# Patient Record
Sex: Male | Born: 1955 | Race: Black or African American | Hispanic: No | Marital: Married
Health system: Southern US, Community
[De-identification: ages and names within clinical notes are randomized; demographics above are authoritative.]

---

## 1999-07-01 ENCOUNTER — Observation Stay (HOSPITAL_COMMUNITY): Admission: EM | Admit: 1999-07-01 | Discharge: 1999-07-02 | Payer: Self-pay | Admitting: Podiatry

## 1999-07-01 ENCOUNTER — Encounter: Payer: Self-pay | Admitting: *Deleted

## 2002-04-21 ENCOUNTER — Encounter: Admission: RE | Admit: 2002-04-21 | Discharge: 2002-04-21 | Payer: Self-pay | Admitting: Internal Medicine

## 2002-04-21 ENCOUNTER — Encounter: Payer: Self-pay | Admitting: Internal Medicine

## 2005-03-08 ENCOUNTER — Encounter: Admission: RE | Admit: 2005-03-08 | Discharge: 2005-03-08 | Payer: Self-pay | Admitting: Internal Medicine

## 2007-01-21 IMAGING — CR DG HIP (WITH OR WITHOUT PELVIS) 2-3V*L*
3 series · 3 of 3 positions shown · non-contrast
Comparison: None.

CLINICAL DATA: Left hip pain.  
 LEFT HIP COMPLETE:

[view not recorded (1 of 3)]
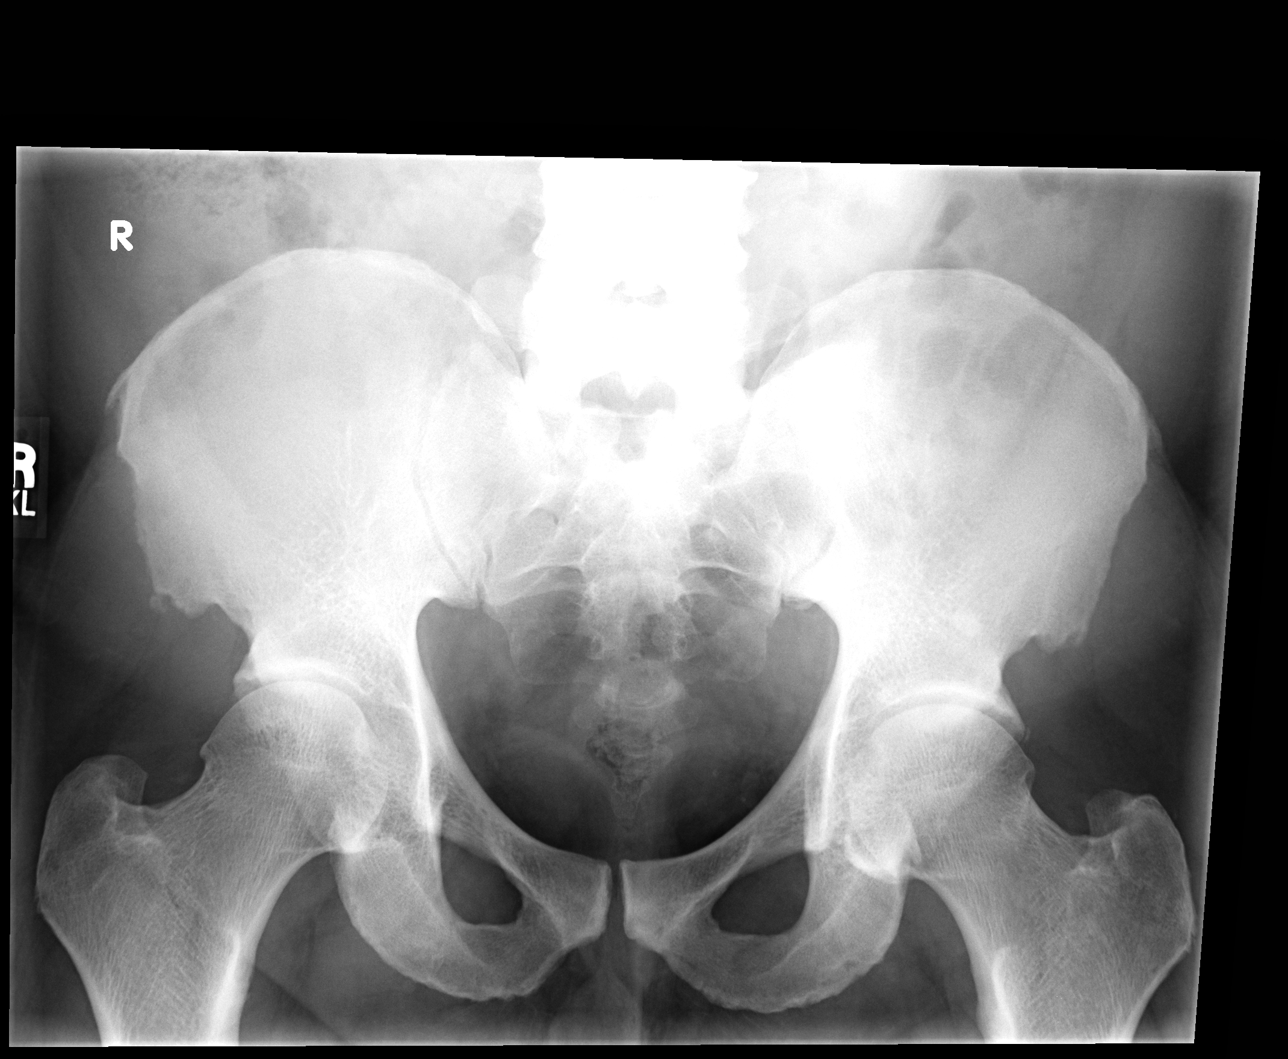

[view not recorded (2 of 3)]
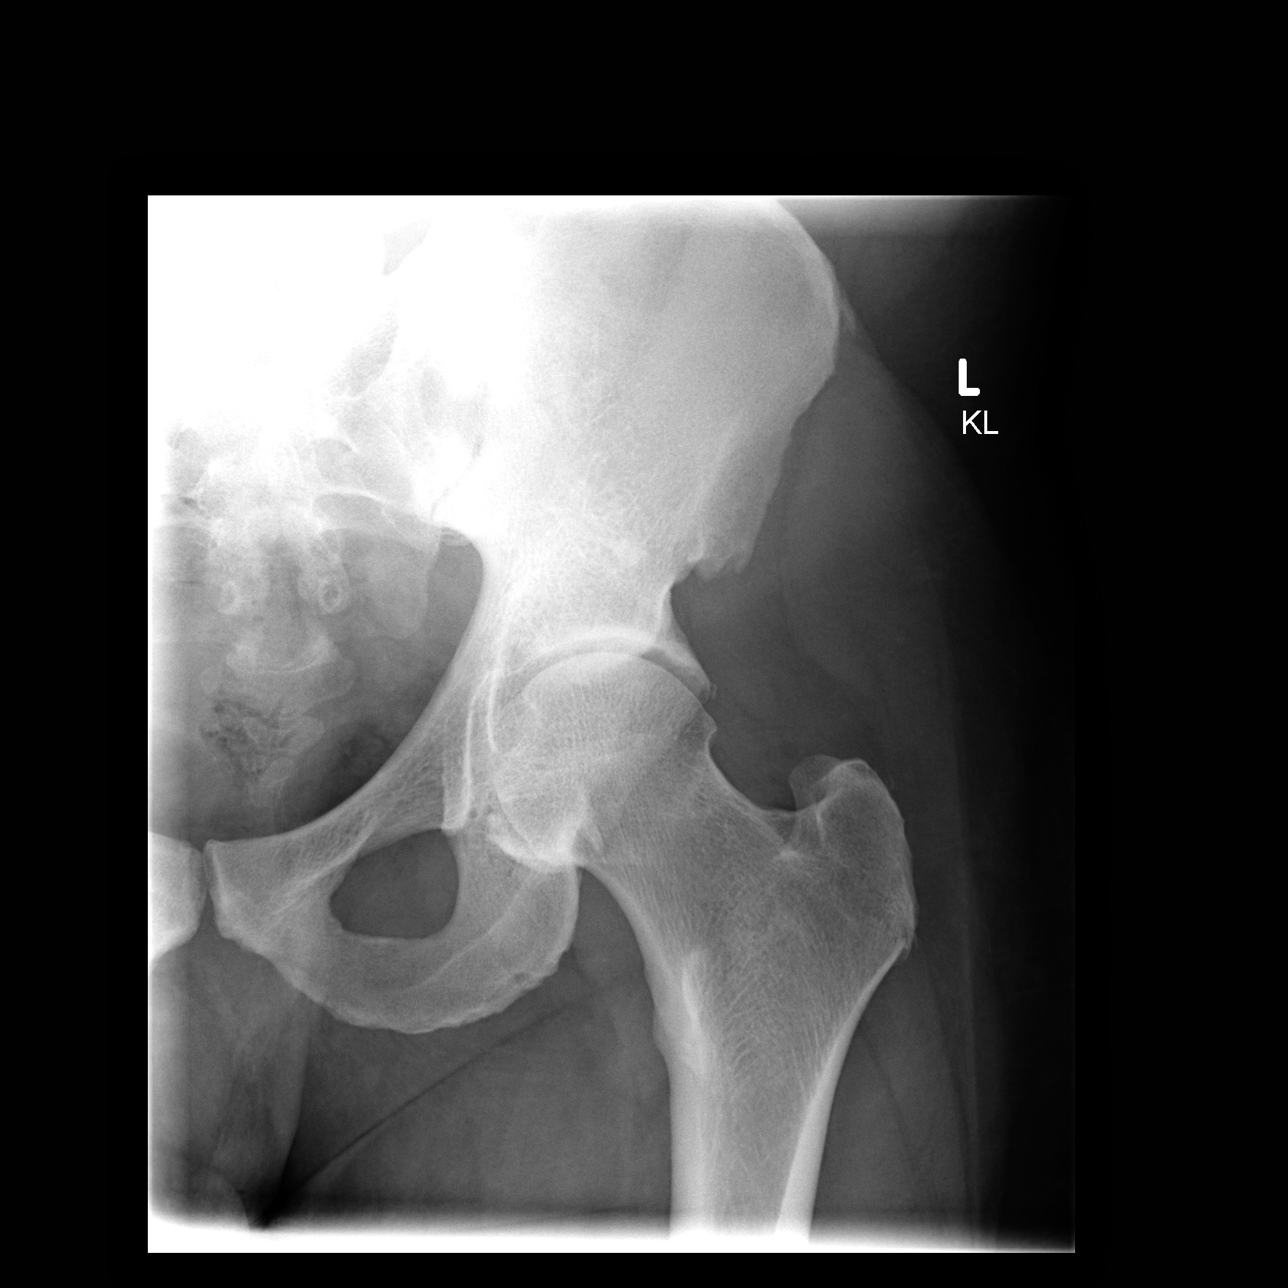

[view not recorded (3 of 3)]
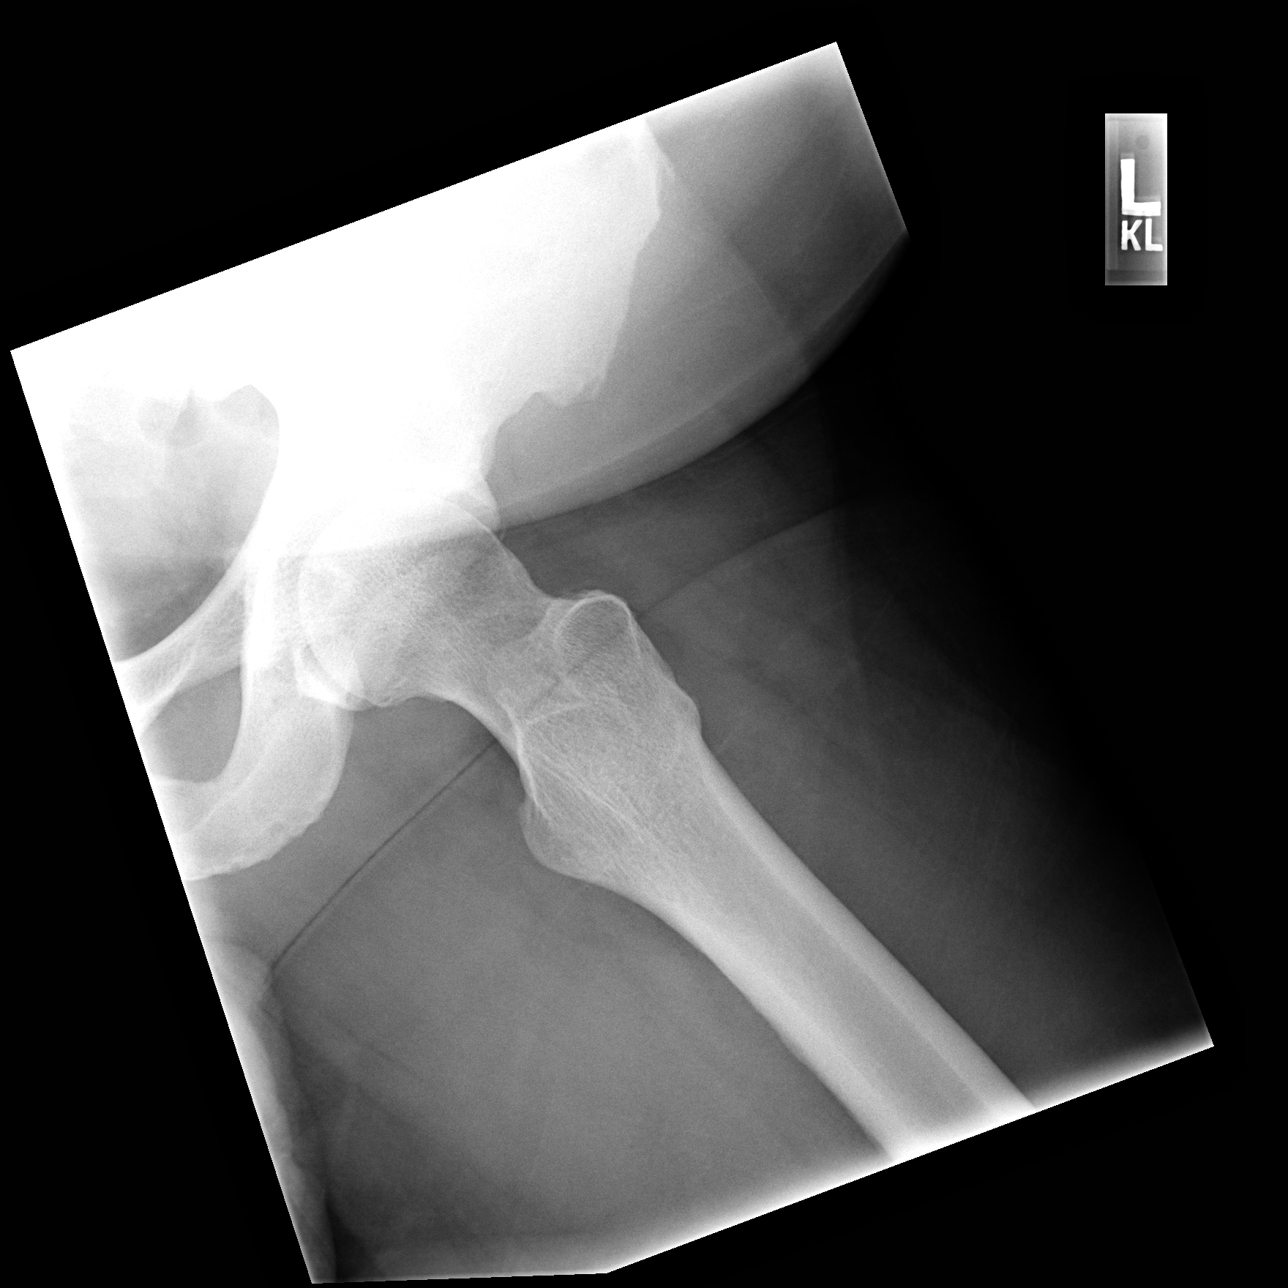

[3 of 3 positions shown; findings below may reference images not displayed]

There are moderate bilateral degenerative changes affecting both hip joints with subchondral sclerosis and osteophyte formation.  
 No acute radiographic abnormalities are noted.  Specifically I see no fractures or dislocations.  Sclerotic density within the left ileum is noted.  In the absence of any history of malignancy, finding is most consistent with a benign bone island.
IMPRESSION: 1.  No acute findings. 
 2.  Moderate bilateral hip osteoarthritis.

## 2019-09-18 ENCOUNTER — Ambulatory Visit: Payer: Self-pay | Attending: Internal Medicine

## 2019-09-18 DIAGNOSIS — Z23 Encounter for immunization: Secondary | ICD-10-CM | POA: Insufficient documentation

## 2019-09-18 NOTE — Progress Notes (Signed)
   [facility] Vaccination [facility]  Name:  Allen Deleon    MRN: [id number] DOB: November 03, 1955  09/18/2019  Mr. Allen Deleon was observed post Covid-19 immunization for 15 minutes without incident. He was provided with Vaccine Information Sheet and instruction to access the V-Safe system.   Mr. Allen Deleon was instructed to call 911 with any severe reactions post vaccine: Marland Kitchen Difficulty breathing  . Swelling of face and throat  . A fast heartbeat  . A bad rash all over body  . Dizziness and weakness   Immunizations Administered    Name Date Dose VIS Date Route   Pfizer COVID-19 Vaccine 09/18/2019  2:20 PM 0.3 mL 06/27/2019 Intramuscular   Manufacturer: [employer], [employer]   Lot: [id number]   NDC: [address]-9

## 2019-10-15 ENCOUNTER — Ambulatory Visit: Payer: Self-pay | Attending: Internal Medicine

## 2019-10-15 DIAGNOSIS — Z23 Encounter for immunization: Secondary | ICD-10-CM

## 2019-10-15 NOTE — Progress Notes (Signed)
   Covid-19 Vaccination Clinic  Name:  Allen Deleon    MRN: [id number] DOB: November 10, 1955  10/15/2019  Mr. Allen Deleon was observed post Covid-19 immunization for 15 minutes without incident. He was provided with Vaccine Information Sheet and instruction to access the V-Safe system.   Mr. Allen Deleon was instructed to call 911 with any severe reactions post vaccine: Marland Kitchen Difficulty breathing  . Swelling of face and throat  . A fast heartbeat  . A bad rash all over body  . Dizziness and weakness   Immunizations Administered    Name Date Dose VIS Date Route   Pfizer COVID-19 Vaccine 10/15/2019  2:10 PM 0.3 mL 06/27/2019 Intramuscular   Manufacturer: [employer], [employer]   Lot: [id number]   NDC: [address]-6

## 2024-04-16 DEATH — deceased
